# Patient Record
Sex: Male | Born: 1995 | Race: White | Hispanic: No | Marital: Single | Smoking: Never smoker
Health system: Southern US, Community
[De-identification: ages and names within clinical notes are randomized; demographics above are authoritative.]

## PROBLEM LIST (undated history)

## (undated) DIAGNOSIS — G930 Cerebral cysts: Secondary | ICD-10-CM

## (undated) DIAGNOSIS — T7840XA Allergy, unspecified, initial encounter: Secondary | ICD-10-CM

## (undated) DIAGNOSIS — S060X9A Concussion with loss of consciousness of unspecified duration, initial encounter: Secondary | ICD-10-CM

## (undated) DIAGNOSIS — S060XAA Concussion with loss of consciousness status unknown, initial encounter: Secondary | ICD-10-CM

## (undated) DIAGNOSIS — S060X0A Concussion without loss of consciousness, initial encounter: Secondary | ICD-10-CM

## (undated) DIAGNOSIS — J45909 Unspecified asthma, uncomplicated: Secondary | ICD-10-CM

## (undated) DIAGNOSIS — L509 Urticaria, unspecified: Secondary | ICD-10-CM

## (undated) HISTORY — DX: Allergy, unspecified, initial encounter: T78.40XA

## (undated) HISTORY — PX: FRACTURE SURGERY: SHX138

## (undated) HISTORY — DX: Concussion with loss of consciousness of unspecified duration, initial encounter: S06.0X9A

## (undated) HISTORY — DX: Concussion with loss of consciousness status unknown, initial encounter: S06.0XAA

## (undated) HISTORY — DX: Urticaria, unspecified: L50.9

## (undated) HISTORY — DX: Concussion without loss of consciousness, initial encounter: S06.0X0A

## (undated) HISTORY — PX: MYRINGOTOMY WITH TUBE PLACEMENT: SHX5663

## (undated) HISTORY — PX: INGUINAL HERNIA REPAIR: SUR1180

## (undated) HISTORY — DX: Unspecified asthma, uncomplicated: J45.909

---
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
## 2017-11-13 ENCOUNTER — Ambulatory Visit (INDEPENDENT_AMBULATORY_CARE_PROVIDER_SITE_OTHER): Payer: BC Managed Care – PPO | Admitting: Family Medicine

## 2017-11-13 ENCOUNTER — Encounter: Payer: Self-pay | Admitting: Family Medicine

## 2017-11-13 ENCOUNTER — Other Ambulatory Visit: Payer: Self-pay

## 2017-11-13 VITALS — BP 100/60 | HR 54 | Temp 97.9°F | Resp 18 | Ht 69.44 in | Wt 161.4 lb

## 2017-11-13 DIAGNOSIS — H6122 Impacted cerumen, left ear: Secondary | ICD-10-CM | POA: Diagnosis not present

## 2017-11-13 DIAGNOSIS — H60552 Acute reactive otitis externa, left ear: Secondary | ICD-10-CM

## 2017-11-13 MED ORDER — CARBAMIDE PEROXIDE 6.5 % OT SOLN: 5.0000 [drp] | Freq: Two times a day (BID) | OTIC | 0 refills | Status: AC

## 2017-11-13 MED ORDER — CIPROFLOXACIN-DEXAMETHASONE 0.3-0.1 % OT SUSP: 4.0000 [drp] | Freq: Two times a day (BID) | OTIC | 0 refills | Status: AC

## 2017-11-13 NOTE — Progress Notes (Signed)
Subjective:    Patient ID: Evan Sutton, male    DOB: [birth year], 22 y.o.   MRN: [id number] Chief Complaint  Patient presents with  . Ear Pain    x1 day, left ear,Pt states ear is full and irritated and having trouble hearing. Pt states he has been using peroxide to help but still feels swollen    HPI  Uses q-tips to clean ears out regularly. Yest developed pain and lost hearing in left ear. Put hydrogen peroxide in sev times but nothing came out and sxs worsening. No URI sxs at all. Feeling fine. No drainage from ear.  Pain is external around ear.  Past Medical History:  Diagnosis Date  . Allergy   . Asthma    no hospitalizations; no ED visits.  . Concussion   . Concussion with no loss of consciousness 07/25/2014   01/2014, 06/2014   . Cyst of brain   . Urticaria    allergy consultation; duration 3-4 months; exacerbation of asthma.   Past Surgical History:  Procedure Laterality Date  . FRACTURE SURGERY     wrist fracture with surgical repair.  . INGUINAL HERNIA REPAIR    . MYRINGOTOMY WITH TUBE PLACEMENT     x 2   Current Outpatient Medications on File Prior to Visit  Medication Sig Dispense Refill  . albuterol (PROVENTIL HFA;VENTOLIN HFA) 108 (90 BASE) MCG/ACT inhaler Inhale 2 puffs into the lungs every 4 (four) hours as needed for shortness of breath. (Patient not taking: Reported on 11/13/2017) 3.7 g 2   No current facility-administered medications on file prior to visit.    Allergies  Allergen Reactions  . Bee Venom    Family History  Problem Relation Age of Onset  . Drug abuse Brother    Social History   Socioeconomic History  . Marital status: Single    Spouse name: n/a  . Number of children: 0  . Years of education: None  . Highest education level: None  Social Needs  . Financial resource strain: None  . Food insecurity - worry: None  . Food insecurity - inability: None  . Transportation needs - medical: None  . Transportation needs - non-medical: None   Occupational History  . Occupation: Consulting civil engineer  Tobacco Use  . Smoking status: Never Smoker  . Smokeless tobacco: Never Used  Substance and Sexual Activity  . Alcohol use: No    Alcohol/week: 0.0 oz  . Drug use: No  . Sexual activity: None  Other Topics Concern  . None  Social History Narrative   Lives with parents.   Plays soccer at [employer].   Hopes to study architecture and engineering.   Depression screen [facility] 2/9 11/13/2017 05/06/2015  Decreased Interest 0 0  Down, Depressed, Hopeless 0 0  PHQ - 2 Score 0 0     Review of Systems  Constitutional: Negative for activity change, appetite change, chills, diaphoresis, fatigue and fever.  HENT: Positive for ear pain and hearing loss. Negative for congestion, dental problem, ear discharge, facial swelling, postnasal drip, rhinorrhea, sinus pressure, sinus pain, sore throat, trouble swallowing and voice change.   Eyes: Negative for pain, discharge and redness.  Respiratory: Negative for cough.   Allergic/Immunologic: Negative for immunocompromised state.  Hematological: Negative for adenopathy.       Objective:   Physical Exam  Constitutional: He is oriented to person, place, and time. He appears well-developed and well-nourished. No distress.  HENT:  Head: Normocephalic and atraumatic.  Right  Ear: No drainage, swelling or tenderness. Tympanic membrane is not erythematous and not retracted. No middle ear effusion. No decreased hearing is noted.  Left Ear: There is swelling and tenderness. No foreign bodies. Tympanic membrane is erythematous. Tympanic membrane is not bulging. No hemotympanum. Decreased hearing is noted.  Rt canal with moderate cerumen, not impacted, normal TM.  Left with erythema of canal and TM. Tenderness of the tragus  Eyes: No scleral icterus.  Pulmonary/Chest: Effort normal.  Lymphadenopathy:       Head (right side): No tonsillar, no preauricular and no posterior auricular adenopathy  present.       Head (left side): No preauricular and no posterior auricular adenopathy present.    He has no cervical adenopathy.  Neurological: He is alert and oriented to person, place, and time.  Skin: Skin is warm and dry. He is not diaphoretic.  Psychiatric: He has a normal mood and affect. His behavior is normal.      BP 100/60 (BP Location: Left Arm, Patient Position: Sitting, Cuff Size: Normal)   Pulse (!) 54   Temp 97.9 F (36.6 C) (Oral)   Resp 18   Ht 5' 9.44" (1.764 m)   Wt 161 lb 6.4 oz (73.2 kg)   SpO2 98%   BMI 23.53 kg/m      Assessment & Plan:   1. Acute reactive otitis externa of left ear   2. Impacted cerumen of left ear     Removed with lavage by CMA and curette by Trena PlattStephanie English, PA-C. Pt w/ relief after. Leaving in 48 hrs for study [place] 5 mos so will cover with ciprodex as he was endorsing pain and quite tender with procedures.  Prevent recurrence with occ ear wax softening gtts.  Meds ordered this encounter  Medications  . ciprofloxacin-dexamethasone (CIPRODEX) OTIC suspension    Sig: Place 4 drops into the left ear 2 (two) times daily.    Dispense:  7.5 mL    Refill:  0  . carbamide peroxide (DEBROX) 6.5 % OTIC solution    Sig: Place 5 drops into the left ear 2 (two) times daily.    Dispense:  15 mL    Refill:  0     Norberto SorensonEva Shaw, M.D.  Primary Care at [facility]  [place] [address] [place], [place] [address] [phone] phone [phone] fax  11/16/17 1:46 PM

## 2017-11-13 NOTE — Patient Instructions (Addendum)
   IF you received an x-ray today, you will receive an invoice from [facility]. Please contact [facility] at [phone] with questions or concerns regarding your invoice.   IF you received labwork today, you will receive an invoice from [facility]. Please contact [facility] at [phone] with questions or concerns regarding your invoice.   Our billing staff will not be able to assist you with questions regarding bills from these companies.  You will be contacted with the lab results as soon as they are available. The fastest way to get your results is to activate your My Chart account. Instructions are located on the last page of this paperwork. If you have not heard from us regarding the results in 2 weeks, please contact this office.     Otitis Externa Otitis externa is an infection of the outer ear canal. The outer ear canal is the area between the outside of the ear and the eardrum. Otitis externa is sometimes called "swimmer's ear." What are the causes? This condition may be caused by:  Swimming in dirty water.  Moisture in the ear.  An injury to the inside of the ear.  An object stuck in the ear.  A cut or scrape on the outside of the ear.  What increases the risk? This condition is more likely to develop in swimmers. What are the signs or symptoms? The first symptom of this condition is often itching in the ear. Later signs and symptoms include:  Swelling of the ear.  Redness in the ear.  Ear pain. The pain may get worse when you pull on your ear.  Pus coming from the ear.  How is this diagnosed? This condition may be diagnosed by examining the ear and testing fluid from the ear for bacteria and funguses. How is this treated? This condition may be treated with:  Antibiotic ear drops. These are often given for 10-14 days.  Medicine to reduce itching and swelling.  Follow these instructions at home:  If you were prescribed antibiotic ear  drops, apply them as told by your health care provider. Do not stop using the antibiotic even if your condition improves.  Take over-the-counter and prescription medicines only as told by your health care provider.  Keep all follow-up visits as told by your health care provider. This is important. How is this prevented?  Keep your ear dry. Use the corner of a towel to dry your ear after you swim or bathe.  Avoid scratching or putting things in your ear. Doing these things can damage the ear canal or remove the protective wax that lines it, which makes it easier for bacteria and funguses to grow.  Avoid swimming in lakes, polluted water, or pools that may not have the right amount of chlorine.  Consider making ear drops and putting 3 or 4 drops in each ear after you swim. Ask your health care provider about how you can make ear drops. Contact a health care provider if:  You have a fever.  After 3 days your ear is still red, swollen, painful, or draining pus.  Your redness, swelling, or pain gets worse.  You have a severe headache.  You have redness, swelling, pain, or tenderness in the area behind your ear. This information is not intended to replace advice given to you by your health care provider. Make sure you discuss any questions you have with your health care provider. Document Released: 10/07/2005 Document Revised: 11/14/2015 Document Reviewed: 07/17/2015 Elsevier Interactive Patient   Education  2018 [employer]  Earwax Buildup, Adult The ears produce a substance called earwax that helps keep bacteria out of the ear and protects the skin in the ear canal. Occasionally, earwax can build up in the ear and cause discomfort or hearing loss. What increases the risk? This condition is more likely to develop in people who:  Are male.  Are elderly.  Naturally produce more earwax.  Clean their ears often with cotton swabs.  Use earplugs often.  Use in-ear headphones  often.  Wear hearing aids.  Have narrow ear canals.  Have earwax that is overly thick or sticky.  Have eczema.  Are dehydrated.  Have excess hair in the ear canal.  What are the signs or symptoms? Symptoms of this condition include:  Reduced or muffled hearing.  A feeling of fullness in the ear or feeling that the ear is plugged.  Fluid coming from the ear.  Ear pain.  Ear itch.  Ringing in the ear.  Coughing.  An obvious piece of earwax that can be seen inside the ear canal.  How is this diagnosed? This condition may be diagnosed based on:  Your symptoms.  Your medical history.  An ear exam. During the exam, your health care provider will look into your ear with an instrument called an otoscope.  You may have tests, including a hearing test. How is this treated? This condition may be treated by:  Using ear drops to soften the earwax.  Having the earwax removed by a health care provider. The health care provider may: ? Flush the ear with water. ? Use an instrument that has a loop on the end (curette). ? Use a suction device.  Surgery to remove the wax buildup. This may be done in severe cases.  Follow these instructions at home:  Take over-the-counter and prescription medicines only as told by your health care provider.  Do not put any objects, including cotton swabs, into your ear. You can clean the opening of your ear canal with a washcloth or facial tissue.  Follow instructions from your health care provider about cleaning your ears. Do not over-clean your ears.  Drink enough fluid to keep your urine clear or pale yellow. This will help to thin the earwax.  Keep all follow-up visits as told by your health care provider. If earwax builds up in your ears often or if you use hearing aids, consider seeing your health care provider for routine, preventive ear cleanings. Ask your health care provider how often you should schedule your cleanings.  If you  have hearing aids, clean them according to instructions from the manufacturer and your health care provider. Contact a health care provider if:  You have ear pain.  You develop a fever.  You have blood, pus, or other fluid coming from your ear.  You have hearing loss.  You have ringing in your ears that does not go away.  Your symptoms do not improve with treatment.  You feel like the room is spinning (vertigo). Summary  Earwax can build up in the ear and cause discomfort or hearing loss.  The most common symptoms of this condition include reduced or muffled hearing and a feeling of fullness in the ear or feeling that the ear is plugged.  This condition may be diagnosed based on your symptoms, your medical history, and an ear exam.  This condition may be treated by using ear drops to soften the earwax or by having the earwax removed  by a health care provider.  Do not put any objects, including cotton swabs, into your ear. You can clean the opening of your ear canal with a washcloth or facial tissue. This information is not intended to replace advice given to you by your health care provider. Make sure you discuss any questions you have with your health care provider. Document Released: 11/14/2004 Document Revised: 12/18/2016 Document Reviewed: 12/18/2016 Elsevier Interactive Patient Education  [employer].

## 2018-05-28 ENCOUNTER — Ambulatory Visit: Payer: BC Managed Care – PPO | Admitting: Physician Assistant

## 2018-06-01 ENCOUNTER — Ambulatory Visit (INDEPENDENT_AMBULATORY_CARE_PROVIDER_SITE_OTHER): Payer: BC Managed Care – PPO | Admitting: Physician Assistant

## 2018-06-01 ENCOUNTER — Other Ambulatory Visit: Payer: Self-pay

## 2018-06-01 ENCOUNTER — Encounter: Payer: Self-pay | Admitting: Physician Assistant

## 2018-06-01 VITALS — BP 107/56 | HR 64 | Temp 97.9°F | Resp 16 | Ht 69.44 in | Wt 161.8 lb

## 2018-06-01 DIAGNOSIS — M791 Myalgia, unspecified site: Secondary | ICD-10-CM | POA: Diagnosis not present

## 2018-06-01 DIAGNOSIS — J069 Acute upper respiratory infection, unspecified: Secondary | ICD-10-CM

## 2018-06-01 LAB — URINALYSIS, DIPSTICK ONLY
Bilirubin, UA: NEGATIVE
Glucose, UA: NEGATIVE
Ketones, UA: NEGATIVE
LEUKOCYTES UA: NEGATIVE
Nitrite, UA: NEGATIVE
PH UA: 6.5 (ref 5.0–7.5)
Protein, UA: NEGATIVE
RBC, UA: NEGATIVE
Specific Gravity, UA: 1.027 (ref 1.005–1.030)
Urobilinogen, Ur: 0.2 mg/dL (ref 0.2–1.0)

## 2018-06-01 MED ORDER — DOXYCYCLINE HYCLATE 100 MG PO CAPS: 100.0000 mg | ORAL_CAPSULE | Freq: Two times a day (BID) | ORAL | 0 refills | Status: AC

## 2018-06-01 NOTE — Progress Notes (Signed)
06/01/2018 11:33 AM   DOB: 1995/11/06 / MRN: [id number]  SUBJECTIVE:  Evan Sutton is a 22 y.o. male presenting for leg pain and states that for about 1 week "it feels like I've just done a leg work out but I haven't." He associates URI symptoms and cough.  No SOB, DOE, leg swell.  The problem is no better or worse. He has tried nothing. This started [place] at summer camp [place] was heavily [place].  He denies overt tic bite and rash. No photophobia, HA.   He is allergic to bee venom.   He  has a past medical history of Allergy, Asthma, Concussion, Concussion with no loss of consciousness (07/25/2014), Cyst of brain, and Urticaria.    He  reports that he has never smoked. He has never used smokeless tobacco. He reports that he does not drink alcohol or use drugs. He  has no sexual activity history on file. The patient  has a past surgical history that includes Myringotomy with tube placement; Inguinal hernia repair; and Fracture surgery.  His family history includes Drug abuse in his brother.  Review of Systems  Constitutional: Negative for diaphoresis, fever and weight loss.  Eyes: Negative for photophobia.  Gastrointestinal: Negative for nausea.  Skin: Negative for rash.  Neurological: Negative for dizziness and headaches.    The problem list and medications were reviewed and updated by myself where necessary and exist elsewhere in the encounter.   OBJECTIVE:  BP (!) 107/56   Pulse 64   Temp 97.9 F (36.6 C)   Resp 16   Ht 5' 9.44" (1.764 m)   Wt 161 lb 12.8 oz (73.4 kg)   SpO2 95%   BMI 23.59 kg/m   Wt Readings from Last 3 Encounters:  06/01/18 161 lb 12.8 oz (73.4 kg)  11/13/17 161 lb 6.4 oz (73.2 kg) (60 %, Z= 0.25)*  06/26/15 143 lb 3.2 oz (65 kg) (47 %, Z= -0.06)*   * Growth percentiles are based on CDC (Boys, 2-20 Years) data.   Temp Readings from Last 3 Encounters:  06/01/18 97.9 F (36.6 C)  11/13/17 97.9 F (36.6 C) (Oral)  06/26/15 98.1 F (36.7 C) (Oral)     BP Readings from Last 3 Encounters:  06/01/18 (!) 107/56  11/13/17 100/60  06/26/15 118/76 (50 %, Z = 0.00 /  77 %, Z = 0.73)*   *BP percentiles are based on the August 2017 AAP Clinical Practice Guideline for boys   Pulse Readings from Last 3 Encounters:  06/01/18 64  11/13/17 (!) 54  06/26/15 64    Physical Exam  Constitutional: He is oriented to person, place, and time. He appears well-developed. He does not appear ill.  Eyes: Pupils are equal, round, and reactive to light. Conjunctivae and EOM are normal.  Cardiovascular: Normal rate.  Pulmonary/Chest: Effort normal.  Abdominal: He exhibits no distension.  Musculoskeletal: Normal range of motion.  Neurological: He is alert and oriented to person, place, and time. No cranial nerve deficit. Coordination normal.  Skin: Skin is warm and dry. He is not diaphoretic.  Psychiatric: He has a normal mood and affect.  Nursing note and vitals reviewed.   No results found for: HGBA1C  Lab Results  Component Value Date   WBC 8.3 08/13/2013   HGB 14.1 08/13/2013   HCT 44.6 08/13/2013   MCV 91.2 08/13/2013    Lab Results  Component Value Date   CREATININE 0.66 08/13/2013   BUN 11 08/13/2013   NA  138 08/13/2013   K 4.5 08/13/2013   CL 101 08/13/2013   CO2 29 08/13/2013    Lab Results  Component Value Date   ALT 16 08/13/2013   AST 22 08/13/2013   ALKPHOS 176 08/13/2013   BILITOT 0.5 08/13/2013    No results found for: TSH  No results found for: CHOL, HDL, LDLCALC, LDLDIRECT, TRIG, CHOLHDL   ASSESSMENT AND PLAN:  Evan Sutton was seen today for fatigue.  Diagnoses and all orders for this visit:  Myalgia: Broad differential.  Normal exam aside from some visibly bothersome URI symptoms.  Starting doxy given this is day nine and he was in a heavily wooded area when these symptoms started.  No lymphadenopathy to lend to mono.  He will start doxy today and follow up with [facility] at [facility] in about 1 week  after his results are released to mychart.  -     doxycycline (VIBRAMYCIN) 100 MG capsule; Take 1 capsule (100 mg total) by mouth 2 (two) times daily for 21 days. -     CBC -     CMP and Liver -     Epstein-Barr virus VCA antibody panel -     Lyme Ab/Western Blot Reflex -     Urinalysis, dipstick only -     CK  Acute URI: See problem one.     The patient is advised to call or return to clinic if he does not see an improvement in symptoms, or to seek the care of the closest emergency department if he worsens with the above plan.   Deliah BostonMichael Paolina Karwowski, MHS, PA-C Primary Care at [facility] 06/01/2018 11:33 AM

## 2018-06-01 NOTE — Patient Instructions (Addendum)
  Follow up with a medical professional in about 1 week.    IF you received an x-ray today, you will receive an invoice from [facility]. Please contact [facility] at [phone] with questions or concerns regarding your invoice.   IF you received labwork today, you will receive an invoice from [facility]. Please contact [facility] at [phone] with questions or concerns regarding your invoice.   Our billing staff will not be able to assist you with questions regarding bills from these companies.  You will be contacted with the lab results as soon as they are available. The fastest way to get your results is to activate your My Chart account. Instructions are located on the last page of this paperwork. If you have not heard from us regarding the results in 2 weeks, please contact this office.

## 2018-06-03 LAB — CBC
HEMATOCRIT: 44.2 % (ref 37.5–51.0)
Hemoglobin: 14.5 g/dL (ref 13.0–17.7)
MCH: 29.8 pg (ref 26.6–33.0)
MCHC: 32.8 g/dL (ref 31.5–35.7)
MCV: 91 fL (ref 79–97)
PLATELETS: 240 10*3/uL (ref 150–450)
RBC: 4.87 x10E6/uL (ref 4.14–5.80)
RDW: 12.6 % (ref 12.3–15.4)
WBC: 5.7 10*3/uL (ref 3.4–10.8)

## 2018-06-03 LAB — CMP AND LIVER
ALK PHOS: 60 IU/L (ref 39–117)
ALT: 13 IU/L (ref 0–44)
AST: 16 IU/L (ref 0–40)
Albumin: 4.7 g/dL (ref 3.5–5.5)
BUN: 12 mg/dL (ref 6–20)
Bilirubin Total: 0.5 mg/dL (ref 0.0–1.2)
Bilirubin, Direct: 0.14 mg/dL (ref 0.00–0.40)
CO2: 25 mmol/L (ref 20–29)
CREATININE: 1.02 mg/dL (ref 0.76–1.27)
Calcium: 9.5 mg/dL (ref 8.7–10.2)
Chloride: 102 mmol/L (ref 96–106)
GFR calc Af Amer: 122 mL/min/{1.73_m2} (ref >59)
GFR calc non Af Amer: 105 mL/min/{1.73_m2} (ref >59)
Glucose: 92 mg/dL (ref 65–99)
Potassium: 4.4 mmol/L (ref 3.5–5.2)
Sodium: 141 mmol/L (ref 134–144)
Total Protein: 7.8 g/dL (ref 6.0–8.5)

## 2018-06-03 LAB — LYME AB/WESTERN BLOT REFLEX
LYME DISEASE AB, QUANT, IGM: <0.8 index (ref 0.00–0.79)
Lyme IgG/IgM Ab: <0.91 {ISR} (ref 0.00–0.90)

## 2018-06-03 LAB — EPSTEIN-BARR VIRUS VCA ANTIBODY PANEL: EBV VCA IGG: 376 U/mL — AB (ref 0.0–17.9)

## 2018-06-03 LAB — CK: CK TOTAL: 78 U/L (ref 24–204)

## 2018-06-19 ENCOUNTER — Other Ambulatory Visit: Payer: Self-pay | Admitting: Physician Assistant

## 2018-06-19 DIAGNOSIS — M791 Myalgia, unspecified site: Secondary | ICD-10-CM

## 2018-06-19 NOTE — Telephone Encounter (Signed)
Left message for pt. To call back in regard to refill. This was for short term treatment. Is he still having symptoms or does he need a follow up appointment?
